# Patient Record
Sex: Female | Born: 1965 | Race: Black or African American | Hispanic: No | Marital: Single | Smoking: Never smoker
Health system: Southern US, Community
[De-identification: ages and names within clinical notes are randomized; demographics above are authoritative.]

## PROBLEM LIST (undated history)

## (undated) DIAGNOSIS — B3731 Acute candidiasis of vulva and vagina: Secondary | ICD-10-CM

## (undated) DIAGNOSIS — I10 Essential (primary) hypertension: Secondary | ICD-10-CM

## (undated) DIAGNOSIS — R300 Dysuria: Secondary | ICD-10-CM

## (undated) DIAGNOSIS — B373 Candidiasis of vulva and vagina: Secondary | ICD-10-CM

## (undated) DIAGNOSIS — N760 Acute vaginitis: Secondary | ICD-10-CM

## (undated) DIAGNOSIS — N6019 Diffuse cystic mastopathy of unspecified breast: Secondary | ICD-10-CM

## (undated) DIAGNOSIS — B9689 Other specified bacterial agents as the cause of diseases classified elsewhere: Secondary | ICD-10-CM

## (undated) DIAGNOSIS — N946 Dysmenorrhea, unspecified: Secondary | ICD-10-CM

## (undated) HISTORY — DX: Diffuse cystic mastopathy of unspecified breast: N60.19

## (undated) HISTORY — DX: Acute vaginitis: N76.0

## (undated) HISTORY — DX: Acute candidiasis of vulva and vagina: B37.31

## (undated) HISTORY — DX: Dysuria: R30.0

## (undated) HISTORY — DX: Dysmenorrhea, unspecified: N94.6

## (undated) HISTORY — DX: Candidiasis of vulva and vagina: B37.3

## (undated) HISTORY — DX: Other specified bacterial agents as the cause of diseases classified elsewhere: B96.89

## (undated) HISTORY — DX: Essential (primary) hypertension: I10

---

## 2000-09-12 ENCOUNTER — Other Ambulatory Visit: Admission: RE | Admit: 2000-09-12 | Discharge: 2000-09-12 | Payer: Self-pay | Admitting: Obstetrics and Gynecology

## 2000-09-19 ENCOUNTER — Encounter: Payer: Self-pay | Admitting: Obstetrics and Gynecology

## 2000-09-19 ENCOUNTER — Encounter: Admission: RE | Admit: 2000-09-19 | Discharge: 2000-09-19 | Payer: Self-pay | Admitting: Obstetrics and Gynecology

## 2001-09-05 ENCOUNTER — Other Ambulatory Visit: Admission: RE | Admit: 2001-09-05 | Discharge: 2001-09-05 | Payer: Self-pay | Admitting: Obstetrics and Gynecology

## 2002-09-18 ENCOUNTER — Other Ambulatory Visit: Admission: RE | Admit: 2002-09-18 | Discharge: 2002-09-18 | Payer: Self-pay | Admitting: Obstetrics and Gynecology

## 2003-07-03 ENCOUNTER — Encounter: Admission: RE | Admit: 2003-07-03 | Discharge: 2003-07-03 | Payer: Self-pay | Admitting: Internal Medicine

## 2003-10-22 ENCOUNTER — Other Ambulatory Visit: Admission: RE | Admit: 2003-10-22 | Discharge: 2003-10-22 | Payer: Self-pay | Admitting: Obstetrics and Gynecology

## 2005-01-18 ENCOUNTER — Other Ambulatory Visit: Admission: RE | Admit: 2005-01-18 | Discharge: 2005-01-18 | Payer: Self-pay | Admitting: Obstetrics and Gynecology

## 2006-01-19 ENCOUNTER — Other Ambulatory Visit: Admission: RE | Admit: 2006-01-19 | Discharge: 2006-01-19 | Payer: Self-pay | Admitting: Obstetrics and Gynecology

## 2006-03-30 ENCOUNTER — Encounter: Admission: RE | Admit: 2006-03-30 | Discharge: 2006-03-30 | Payer: Self-pay | Admitting: Internal Medicine

## 2007-05-03 ENCOUNTER — Encounter: Admission: RE | Admit: 2007-05-03 | Discharge: 2007-05-03 | Payer: Self-pay | Admitting: Internal Medicine

## 2008-05-14 ENCOUNTER — Encounter: Admission: RE | Admit: 2008-05-14 | Discharge: 2008-05-14 | Payer: Self-pay | Admitting: Internal Medicine

## 2009-05-28 ENCOUNTER — Encounter: Admission: RE | Admit: 2009-05-28 | Discharge: 2009-05-28 | Payer: Self-pay | Admitting: Internal Medicine

## 2010-05-31 LAB — BASIC METABOLIC PANEL
BUN: 12 mg/dL (ref 4–21)
Creatinine: 0.7 mg/dL (ref <=1.1)

## 2010-06-18 ENCOUNTER — Encounter
Admission: RE | Admit: 2010-06-18 | Discharge: 2010-06-18 | Payer: Self-pay | Source: Home / Self Care | Attending: Internal Medicine | Admitting: Internal Medicine

## 2010-08-13 ENCOUNTER — Ambulatory Visit
Admission: RE | Admit: 2010-08-13 | Discharge: 2010-08-13 | Disposition: A | Discharge status: Home / Self Care | Payer: Managed Care, Other (non HMO) | Source: Ambulatory Visit | Attending: Internal Medicine | Admitting: Internal Medicine

## 2010-08-13 ENCOUNTER — Other Ambulatory Visit: Payer: Self-pay | Admitting: Internal Medicine

## 2010-08-13 DIAGNOSIS — J40 Bronchitis, not specified as acute or chronic: Secondary | ICD-10-CM

## 2011-06-07 ENCOUNTER — Other Ambulatory Visit: Payer: Self-pay | Admitting: Internal Medicine

## 2011-06-07 DIAGNOSIS — Z1231 Encounter for screening mammogram for malignant neoplasm of breast: Secondary | ICD-10-CM

## 2011-06-27 DIAGNOSIS — N926 Irregular menstruation, unspecified: Secondary | ICD-10-CM | POA: Insufficient documentation

## 2011-06-29 ENCOUNTER — Ambulatory Visit: Payer: Managed Care, Other (non HMO)

## 2011-07-05 ENCOUNTER — Ambulatory Visit
Admission: RE | Admit: 2011-07-05 | Discharge: 2011-07-05 | Disposition: A | Discharge status: Home / Self Care | Payer: Managed Care, Other (non HMO) | Source: Ambulatory Visit | Attending: Internal Medicine | Admitting: Internal Medicine

## 2011-07-05 DIAGNOSIS — Z1231 Encounter for screening mammogram for malignant neoplasm of breast: Secondary | ICD-10-CM

## 2011-07-11 ENCOUNTER — Ambulatory Visit: Payer: Managed Care, Other (non HMO)

## 2011-08-16 DIAGNOSIS — I10 Essential (primary) hypertension: Secondary | ICD-10-CM

## 2011-08-16 DIAGNOSIS — N946 Dysmenorrhea, unspecified: Secondary | ICD-10-CM

## 2011-09-28 ENCOUNTER — Ambulatory Visit: Payer: Self-pay | Admitting: Obstetrics and Gynecology

## 2011-10-25 ENCOUNTER — Ambulatory Visit: Payer: Self-pay | Admitting: Obstetrics and Gynecology

## 2011-11-15 ENCOUNTER — Ambulatory Visit: Payer: Self-pay | Admitting: Obstetrics and Gynecology

## 2011-11-23 ENCOUNTER — Telehealth: Payer: Self-pay | Admitting: Obstetrics and Gynecology

## 2011-11-23 NOTE — Telephone Encounter (Signed)
Spoke with pt rgd rx refill request from [facility]. Pt stated that she is requesting the refill.told pt needed to get approval from [facility] and once approved would fax to the pharmacy.

## 2011-11-28 NOTE — Telephone Encounter (Signed)
On [employer] desk waiting on approval for rx .  bt  cma

## 2011-11-30 ENCOUNTER — Other Ambulatory Visit: Payer: Self-pay

## 2011-11-30 ENCOUNTER — Telehealth: Payer: Self-pay

## 2011-11-30 NOTE — Telephone Encounter (Signed)
Per approved for refill per [facility]  Rx for IBP 800mg  was called in at [facility] per [facility].  LC/CMA

## 2011-12-01 ENCOUNTER — Other Ambulatory Visit: Payer: Self-pay | Admitting: Obstetrics and Gynecology

## 2011-12-07 ENCOUNTER — Encounter: Payer: Self-pay | Admitting: Obstetrics and Gynecology

## 2011-12-16 ENCOUNTER — Telehealth: Payer: Self-pay

## 2011-12-16 NOTE — Telephone Encounter (Signed)
Rx request sent to Dr.Stringer will await his approval. LC CMA

## 2011-12-16 NOTE — Telephone Encounter (Signed)
Rx request for IBP 800mg  in triage pool. Pt just received IBP 800mg  on 11-30-2011 w/2 RF's #40 pills  I called pt to confirm if she needed IBP  Pt is having issues w/ her phone she could not hear me but I could hear her. I will await pt call back. [facility] CMA

## 2012-01-04 ENCOUNTER — Ambulatory Visit: Payer: Self-pay | Admitting: Obstetrics and Gynecology

## 2012-01-09 ENCOUNTER — Encounter: Payer: Self-pay | Admitting: Obstetrics and Gynecology

## 2012-01-24 ENCOUNTER — Ambulatory Visit (INDEPENDENT_AMBULATORY_CARE_PROVIDER_SITE_OTHER): Payer: Managed Care, Other (non HMO) | Admitting: Obstetrics and Gynecology

## 2012-01-24 ENCOUNTER — Encounter: Payer: Self-pay | Admitting: Obstetrics and Gynecology

## 2012-01-24 VITALS — BP 104/62 | Resp 14 | Ht 68.5 in | Wt 140.0 lb

## 2012-01-24 DIAGNOSIS — N760 Acute vaginitis: Secondary | ICD-10-CM

## 2012-01-24 MED ORDER — FLUCONAZOLE 150 MG PO TABS: 150.0000 mg | ORAL_TABLET | Freq: Every day | ORAL | Status: AC

## 2012-01-24 MED ORDER — AMBULATORY NON FORMULARY MEDICATION: 24.0000 | Status: DC

## 2012-01-24 NOTE — Progress Notes (Signed)
HISTORY OF PRESENT ILLNESS  Ms. Jennifer Orozco is a 46 y.o. year old female,No obstetric history on file., who presents for a problem visit. The patient has a history of irregular cycles.  Her cycles are now regular.  She is taking minocycline for acne.  Subjective:  The patient complains of recurrent yeast infections.  Objective:  BP 104/62  Resp 14  Ht 5' 8.5" (1.74 m)  Wt 140 lb (63.504 kg)  BMI 20.98 kg/m2  LMP 01/08/2012   GI: soft and nontender  External genitalia: normal general appearance Vaginal: normal without tenderness, induration or masses Cervix: normal appearance Adnexa: normal bimanual exam Uterus: normal size shape and consistency  Wet prep: Yeast  Assessment:  Yeast vaginitis Improved cycles  Plan:  Diflucan 150 mg Boric acid suppositories 600 mg twice each week  Return to office prn if symptoms worsen or fail to improve. Return in December 2013 for annual exam.   Leonard Schwartz M.D.  01/24/2012 5:21 PM

## 2012-04-09 ENCOUNTER — Other Ambulatory Visit: Payer: Self-pay | Admitting: Internal Medicine

## 2012-04-09 ENCOUNTER — Ambulatory Visit
Admission: RE | Admit: 2012-04-09 | Discharge: 2012-04-09 | Disposition: A | Discharge status: Home / Self Care | Payer: Managed Care, Other (non HMO) | Source: Ambulatory Visit | Attending: Internal Medicine | Admitting: Internal Medicine

## 2012-04-09 DIAGNOSIS — M79672 Pain in left foot: Secondary | ICD-10-CM

## 2012-06-22 ENCOUNTER — Other Ambulatory Visit: Payer: Self-pay | Admitting: Internal Medicine

## 2012-06-22 DIAGNOSIS — Z1231 Encounter for screening mammogram for malignant neoplasm of breast: Secondary | ICD-10-CM

## 2012-07-16 ENCOUNTER — Ambulatory Visit: Payer: Managed Care, Other (non HMO)

## 2012-08-01 ENCOUNTER — Ambulatory Visit: Payer: Managed Care, Other (non HMO) | Admitting: Obstetrics and Gynecology

## 2012-08-14 ENCOUNTER — Ambulatory Visit: Payer: Managed Care, Other (non HMO) | Admitting: Obstetrics and Gynecology

## 2012-08-15 ENCOUNTER — Inpatient Hospital Stay (HOSPITAL_BASED_OUTPATIENT_CLINIC_OR_DEPARTMENT_OTHER): Admission: RE | Admit: 2012-08-15 | Payer: Managed Care, Other (non HMO) | Source: Ambulatory Visit

## 2012-08-29 ENCOUNTER — Ambulatory Visit (HOSPITAL_BASED_OUTPATIENT_CLINIC_OR_DEPARTMENT_OTHER): Payer: Managed Care, Other (non HMO)

## 2012-09-12 ENCOUNTER — Ambulatory Visit (HOSPITAL_BASED_OUTPATIENT_CLINIC_OR_DEPARTMENT_OTHER)
Admission: RE | Admit: 2012-09-12 | Discharge: 2012-09-12 | Disposition: A | Discharge status: Home / Self Care | Payer: Managed Care, Other (non HMO) | Source: Ambulatory Visit | Attending: Internal Medicine | Admitting: Internal Medicine

## 2012-09-12 DIAGNOSIS — Z1231 Encounter for screening mammogram for malignant neoplasm of breast: Secondary | ICD-10-CM

## 2012-11-01 ENCOUNTER — Encounter: Payer: Self-pay | Admitting: *Deleted

## 2013-09-12 ENCOUNTER — Other Ambulatory Visit (HOSPITAL_BASED_OUTPATIENT_CLINIC_OR_DEPARTMENT_OTHER): Payer: Self-pay | Admitting: Internal Medicine

## 2013-09-12 DIAGNOSIS — Z1231 Encounter for screening mammogram for malignant neoplasm of breast: Secondary | ICD-10-CM

## 2013-09-18 ENCOUNTER — Ambulatory Visit (HOSPITAL_BASED_OUTPATIENT_CLINIC_OR_DEPARTMENT_OTHER)
Admission: RE | Admit: 2013-09-18 | Discharge: 2013-09-18 | Disposition: A | Discharge status: Home / Self Care | Payer: BC Managed Care – PPO | Source: Ambulatory Visit | Attending: Internal Medicine | Admitting: Internal Medicine

## 2013-09-18 DIAGNOSIS — Z1231 Encounter for screening mammogram for malignant neoplasm of breast: Secondary | ICD-10-CM | POA: Insufficient documentation

## 2014-09-15 ENCOUNTER — Other Ambulatory Visit (HOSPITAL_BASED_OUTPATIENT_CLINIC_OR_DEPARTMENT_OTHER): Payer: Self-pay | Admitting: Internal Medicine

## 2014-09-15 DIAGNOSIS — Z1231 Encounter for screening mammogram for malignant neoplasm of breast: Secondary | ICD-10-CM

## 2014-10-10 ENCOUNTER — Ambulatory Visit (HOSPITAL_BASED_OUTPATIENT_CLINIC_OR_DEPARTMENT_OTHER): Payer: Managed Care, Other (non HMO)

## 2014-10-14 ENCOUNTER — Ambulatory Visit (HOSPITAL_BASED_OUTPATIENT_CLINIC_OR_DEPARTMENT_OTHER): Payer: Managed Care, Other (non HMO)

## 2014-10-21 ENCOUNTER — Ambulatory Visit (HOSPITAL_BASED_OUTPATIENT_CLINIC_OR_DEPARTMENT_OTHER): Payer: Managed Care, Other (non HMO)

## 2014-10-28 ENCOUNTER — Ambulatory Visit (HOSPITAL_BASED_OUTPATIENT_CLINIC_OR_DEPARTMENT_OTHER): Payer: Managed Care, Other (non HMO)

## 2014-11-11 ENCOUNTER — Ambulatory Visit (HOSPITAL_BASED_OUTPATIENT_CLINIC_OR_DEPARTMENT_OTHER)
Admission: RE | Admit: 2014-11-11 | Discharge: 2014-11-11 | Disposition: A | Discharge status: Home / Self Care | Payer: 59 | Source: Ambulatory Visit | Attending: Internal Medicine | Admitting: Internal Medicine

## 2014-11-11 DIAGNOSIS — Z1231 Encounter for screening mammogram for malignant neoplasm of breast: Secondary | ICD-10-CM | POA: Insufficient documentation

## 2016-04-08 ENCOUNTER — Ambulatory Visit: Payer: Self-pay | Admitting: Podiatry

## 2016-04-11 ENCOUNTER — Ambulatory Visit: Payer: Self-pay

## 2016-04-11 ENCOUNTER — Ambulatory Visit (INDEPENDENT_AMBULATORY_CARE_PROVIDER_SITE_OTHER): Payer: Commercial Managed Care - HMO | Admitting: Podiatry

## 2016-04-11 ENCOUNTER — Encounter: Payer: Self-pay | Admitting: Podiatry

## 2016-04-11 VITALS — BP 140/67 | HR 57 | Resp 16 | Ht 68.5 in | Wt 145.0 lb

## 2016-04-11 DIAGNOSIS — Q828 Other specified congenital malformations of skin: Secondary | ICD-10-CM | POA: Diagnosis not present

## 2016-04-11 DIAGNOSIS — M79671 Pain in right foot: Secondary | ICD-10-CM

## 2016-04-11 DIAGNOSIS — B070 Plantar wart: Secondary | ICD-10-CM | POA: Diagnosis not present

## 2016-04-11 NOTE — Progress Notes (Signed)
   Subjective:    Patient ID: Jennifer Orozco, female    DOB: 28-Dec-1965, 50 y.o.   MRN: [id number]  HPI 50 year old female presents the office today for painful lesion to the bottom of the right foot and the ball the foot which is been ongoing for a long time. She states that she saw her primary care physician and she was told it was a wart. She said no treatment for this. She says the pain is intermittent and worse with a lot of walking or weightbearing. Denies any redness or drainage and denies today for not 6. No other complaints at this time.   Review of Systems  All other systems reviewed and are negative.      Objective:   Physical Exam General: AAO x3, NAD  Dermatological: On the right foot submetatarsal to there is a hyperkeratotic lesion. Upon debridement there is no pinpoint bleeding there is no evidence of verruca. There is no evidence of foreign body.  Vascular: Dorsalis Pedis artery and Posterior Tibial artery pedal pulses are 2/4 bilateral with immedate capillary fill time. There is no pain with calf compression, swelling, warmth, erythema.   Neruologic: Grossly intact via light touch bilateral. Vibratory intact via tuning fork bilateral. Protective threshold with Semmes Wienstein monofilament intact to all pedal sites bilateral.   Musculoskeletal: Tenderness sharply on the hyperkeratotic lesion on the right foot. Mild HAV is present. No other areas of tenderness. No gross boney pedal deformities bilateral. No pain, crepitus, or limitation noted with foot and ankle range of motion bilateral. Muscular strength 5/5 in all groups tested bilateral.  Gait: Unassisted, Nonantalgic.    Assessment & Plan:  50 year old female right foot submetatarsal to porokeratosis -Treatment options discussed including all alternatives, risks, and complications -Etiology of symptoms were discussed -Hyperkeratotic lesion debrided without complications or bleeding. The area was cleaned. Pad was  placed followed by salinocaine and an occlusive bandage. Post procedure instructions were discussed. Monitor for infection. -Discussed shoe modifications and orthotics -Follow up if symptoms continue. Call any questions or concerns meantime.  Ovid CurdMatthew Wagoner, DPM  *x-ray next appointment if symptoms continue.

## 2017-03-14 ENCOUNTER — Encounter (HOSPITAL_BASED_OUTPATIENT_CLINIC_OR_DEPARTMENT_OTHER): Payer: Self-pay

## 2017-03-14 ENCOUNTER — Emergency Department (HOSPITAL_BASED_OUTPATIENT_CLINIC_OR_DEPARTMENT_OTHER)
Admission: EM | Admit: 2017-03-14 | Discharge: 2017-03-14 | Disposition: A | Discharge status: Home / Self Care | Payer: 59 | Attending: Emergency Medicine | Admitting: Emergency Medicine

## 2017-03-14 ENCOUNTER — Emergency Department (HOSPITAL_BASED_OUTPATIENT_CLINIC_OR_DEPARTMENT_OTHER): Payer: 59

## 2017-03-14 DIAGNOSIS — Z79899 Other long term (current) drug therapy: Secondary | ICD-10-CM | POA: Diagnosis not present

## 2017-03-14 DIAGNOSIS — I10 Essential (primary) hypertension: Secondary | ICD-10-CM | POA: Diagnosis not present

## 2017-03-14 DIAGNOSIS — R202 Paresthesia of skin: Secondary | ICD-10-CM

## 2017-03-14 DIAGNOSIS — E785 Hyperlipidemia, unspecified: Secondary | ICD-10-CM | POA: Insufficient documentation

## 2017-03-14 LAB — COMPREHENSIVE METABOLIC PANEL
ALK PHOS: 44 U/L (ref 38–126)
ALT: 13 U/L — ABNORMAL LOW (ref 14–54)
ANION GAP: 6 (ref 5–15)
AST: 18 U/L (ref 15–41)
Albumin: 4.3 g/dL (ref 3.5–5.0)
BILIRUBIN TOTAL: 0.8 mg/dL (ref 0.3–1.2)
BUN: 9 mg/dL (ref 6–20)
CALCIUM: 9.7 mg/dL (ref 8.9–10.3)
CO2: 27 mmol/L (ref 22–32)
Chloride: 104 mmol/L (ref 101–111)
Creatinine, Ser: 0.72 mg/dL (ref 0.44–1.00)
GLUCOSE: 89 mg/dL (ref 65–99)
Potassium: 3.8 mmol/L (ref 3.5–5.1)
Sodium: 137 mmol/L (ref 135–145)
TOTAL PROTEIN: 8.5 g/dL — AB (ref 6.5–8.1)

## 2017-03-14 LAB — CBC WITH DIFFERENTIAL/PLATELET
BASOS ABS: 0 10*3/uL (ref 0.0–0.1)
BASOS PCT: 0 %
Eosinophils Absolute: 0 10*3/uL (ref 0.0–0.7)
Eosinophils Relative: 0 %
HEMATOCRIT: 39.3 % (ref 36.0–46.0)
Hemoglobin: 13.8 g/dL (ref 12.0–15.0)
Lymphocytes Relative: 29 %
Lymphs Abs: 2.1 10*3/uL (ref 0.7–4.0)
MCH: 27 pg (ref 26.0–34.0)
MCHC: 35.1 g/dL (ref 30.0–36.0)
MCV: 76.8 fL — ABNORMAL LOW (ref 78.0–100.0)
MONO ABS: 0.5 10*3/uL (ref 0.1–1.0)
Monocytes Relative: 6 %
NEUTROS ABS: 4.7 10*3/uL (ref 1.7–7.7)
NEUTROS PCT: 65 %
Platelets: 242 10*3/uL (ref 150–400)
RBC: 5.12 MIL/uL — AB (ref 3.87–5.11)
RDW: 15.5 % (ref 11.5–15.5)
WBC: 7.3 10*3/uL (ref 4.0–10.5)

## 2017-03-14 LAB — PROTIME-INR
INR: 1.01
Prothrombin Time: 13.2 seconds (ref 11.4–15.2)

## 2017-03-14 LAB — APTT: APTT: 31 s (ref 24–36)
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
## 2017-03-14 NOTE — ED Notes (Signed)
Patient transported to CT 

## 2017-03-14 NOTE — ED Triage Notes (Signed)
C/o a tingling sensation to left arm, "feel shaky" x 30 min-NAD-steady gait

## 2017-03-14 NOTE — Discharge Instructions (Signed)
It was my pleasure taking care of you today!   Fortunately your labs and imaging today were very reassuring.   You need to call your primary care provider to arrange for a follow up appointment.   Return to ER for new or worsening symptoms, any additional concerns.

## 2020-11-24 ENCOUNTER — Ambulatory Visit
Admission: RE | Admit: 2020-11-24 | Discharge: 2020-11-24 | Disposition: A | Discharge status: Home / Self Care | Payer: No Typology Code available for payment source | Source: Ambulatory Visit | Attending: Internal Medicine | Admitting: Internal Medicine

## 2020-11-24 ENCOUNTER — Other Ambulatory Visit: Payer: Self-pay | Admitting: Internal Medicine

## 2020-11-24 DIAGNOSIS — K5900 Constipation, unspecified: Secondary | ICD-10-CM

## 2020-11-24 DIAGNOSIS — R109 Unspecified abdominal pain: Secondary | ICD-10-CM

## 2021-04-19 ENCOUNTER — Other Ambulatory Visit: Payer: Self-pay | Admitting: Orthopedic Surgery

## 2021-05-31 ENCOUNTER — Ambulatory Visit (HOSPITAL_BASED_OUTPATIENT_CLINIC_OR_DEPARTMENT_OTHER): Admit: 2021-05-31 | Payer: No Typology Code available for payment source | Admitting: Orthopedic Surgery

## 2021-05-31 ENCOUNTER — Encounter (HOSPITAL_BASED_OUTPATIENT_CLINIC_OR_DEPARTMENT_OTHER): Payer: Self-pay

## 2021-05-31 SURGERY — RELEASE, A1 PULLEY, FOR TRIGGER FINGER
Anesthesia: Choice | Laterality: Right

## 2021-11-03 ENCOUNTER — Other Ambulatory Visit: Payer: Self-pay | Admitting: Internal Medicine

## 2021-11-03 ENCOUNTER — Ambulatory Visit
Admission: RE | Admit: 2021-11-03 | Discharge: 2021-11-03 | Disposition: A | Discharge status: Home / Self Care | Payer: No Typology Code available for payment source | Source: Ambulatory Visit | Attending: Internal Medicine | Admitting: Internal Medicine

## 2021-11-03 DIAGNOSIS — I10 Essential (primary) hypertension: Secondary | ICD-10-CM

## 2021-12-22 IMAGING — CR DG ABDOMEN 2V
2 series · 2 of 2 positions shown · non-contrast
Comparison: None.

CLINICAL DATA: Abdominal pain.  Constipation and diarrhea.

EXAM:
ABDOMEN - 2 VIEW

[w abdomen upright]
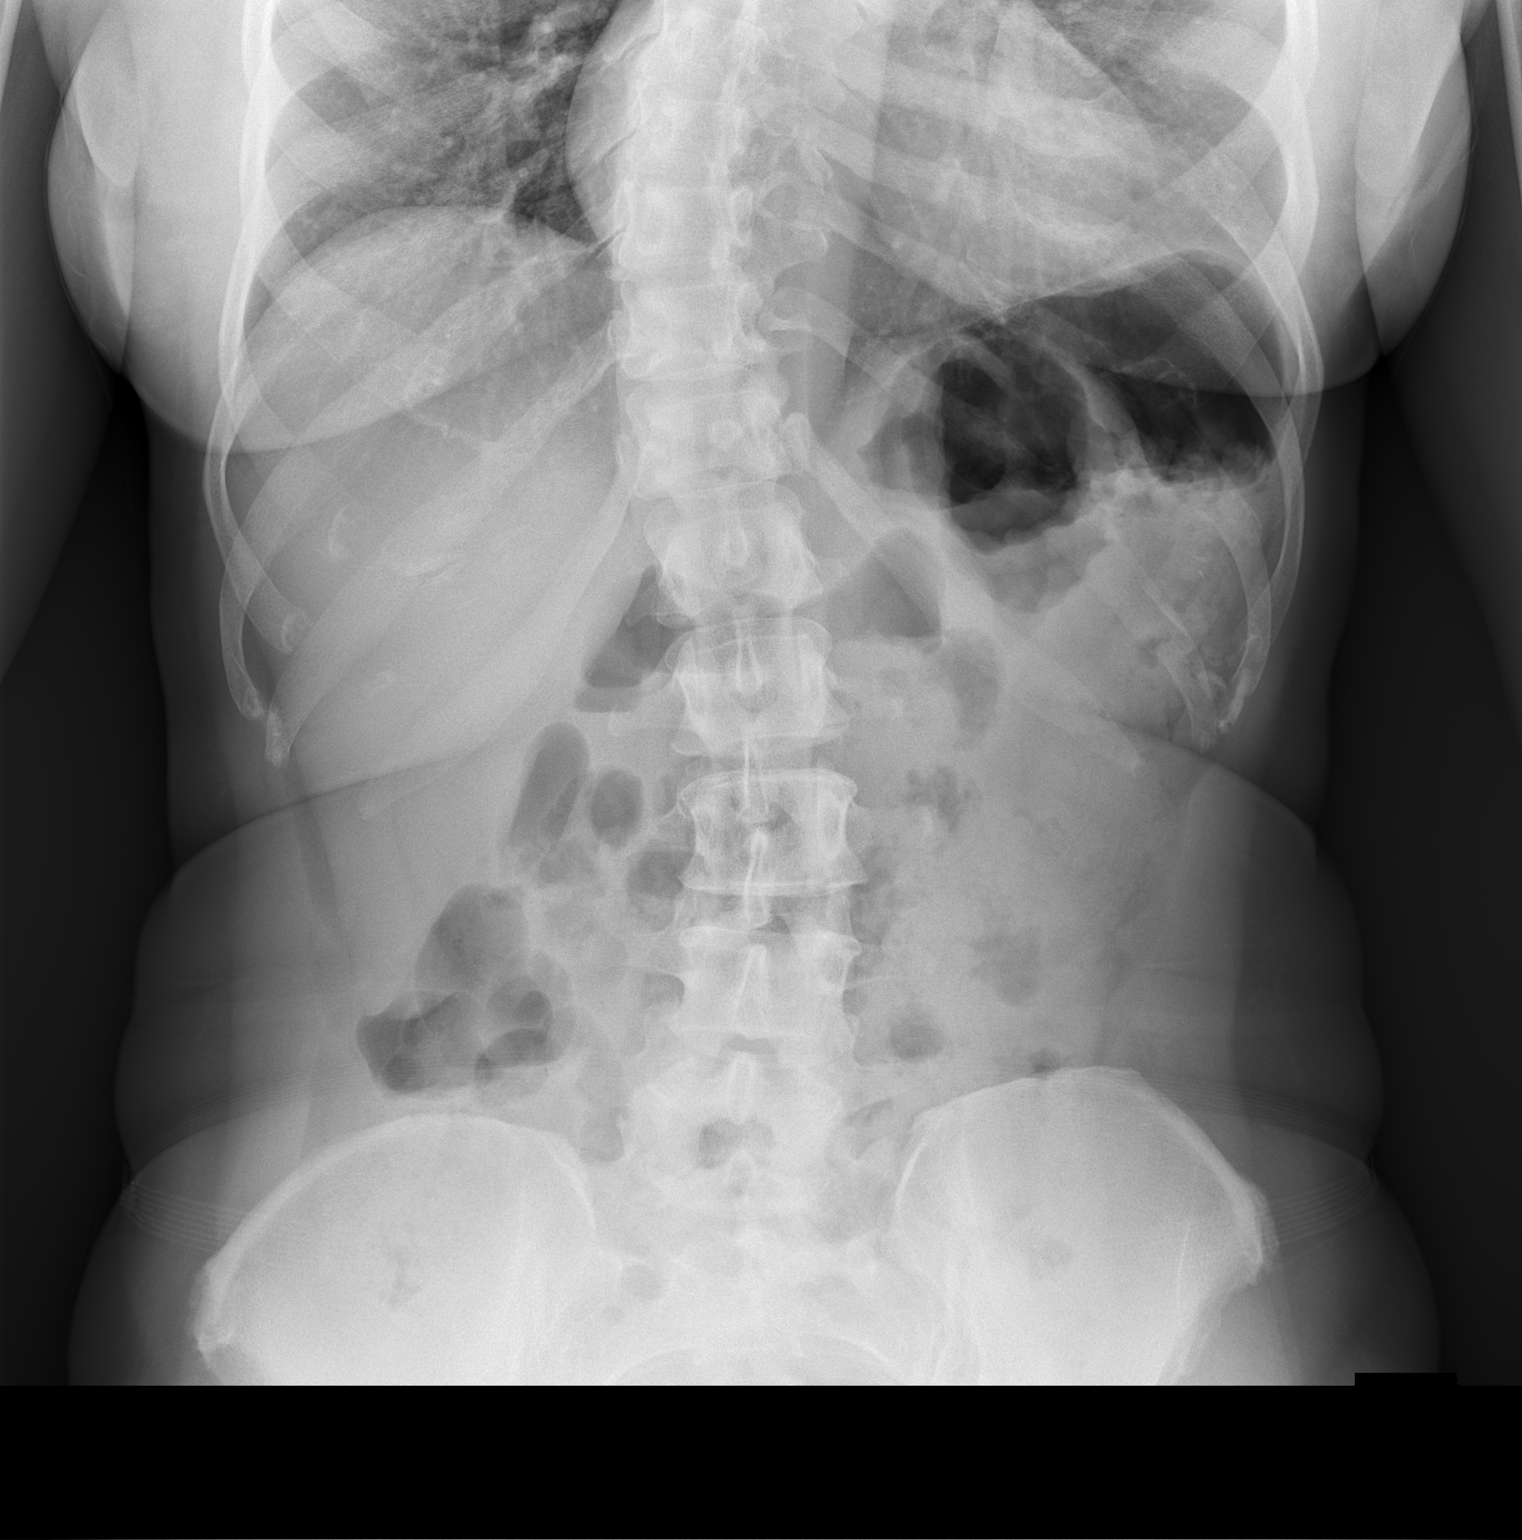

[t abdomen supine]
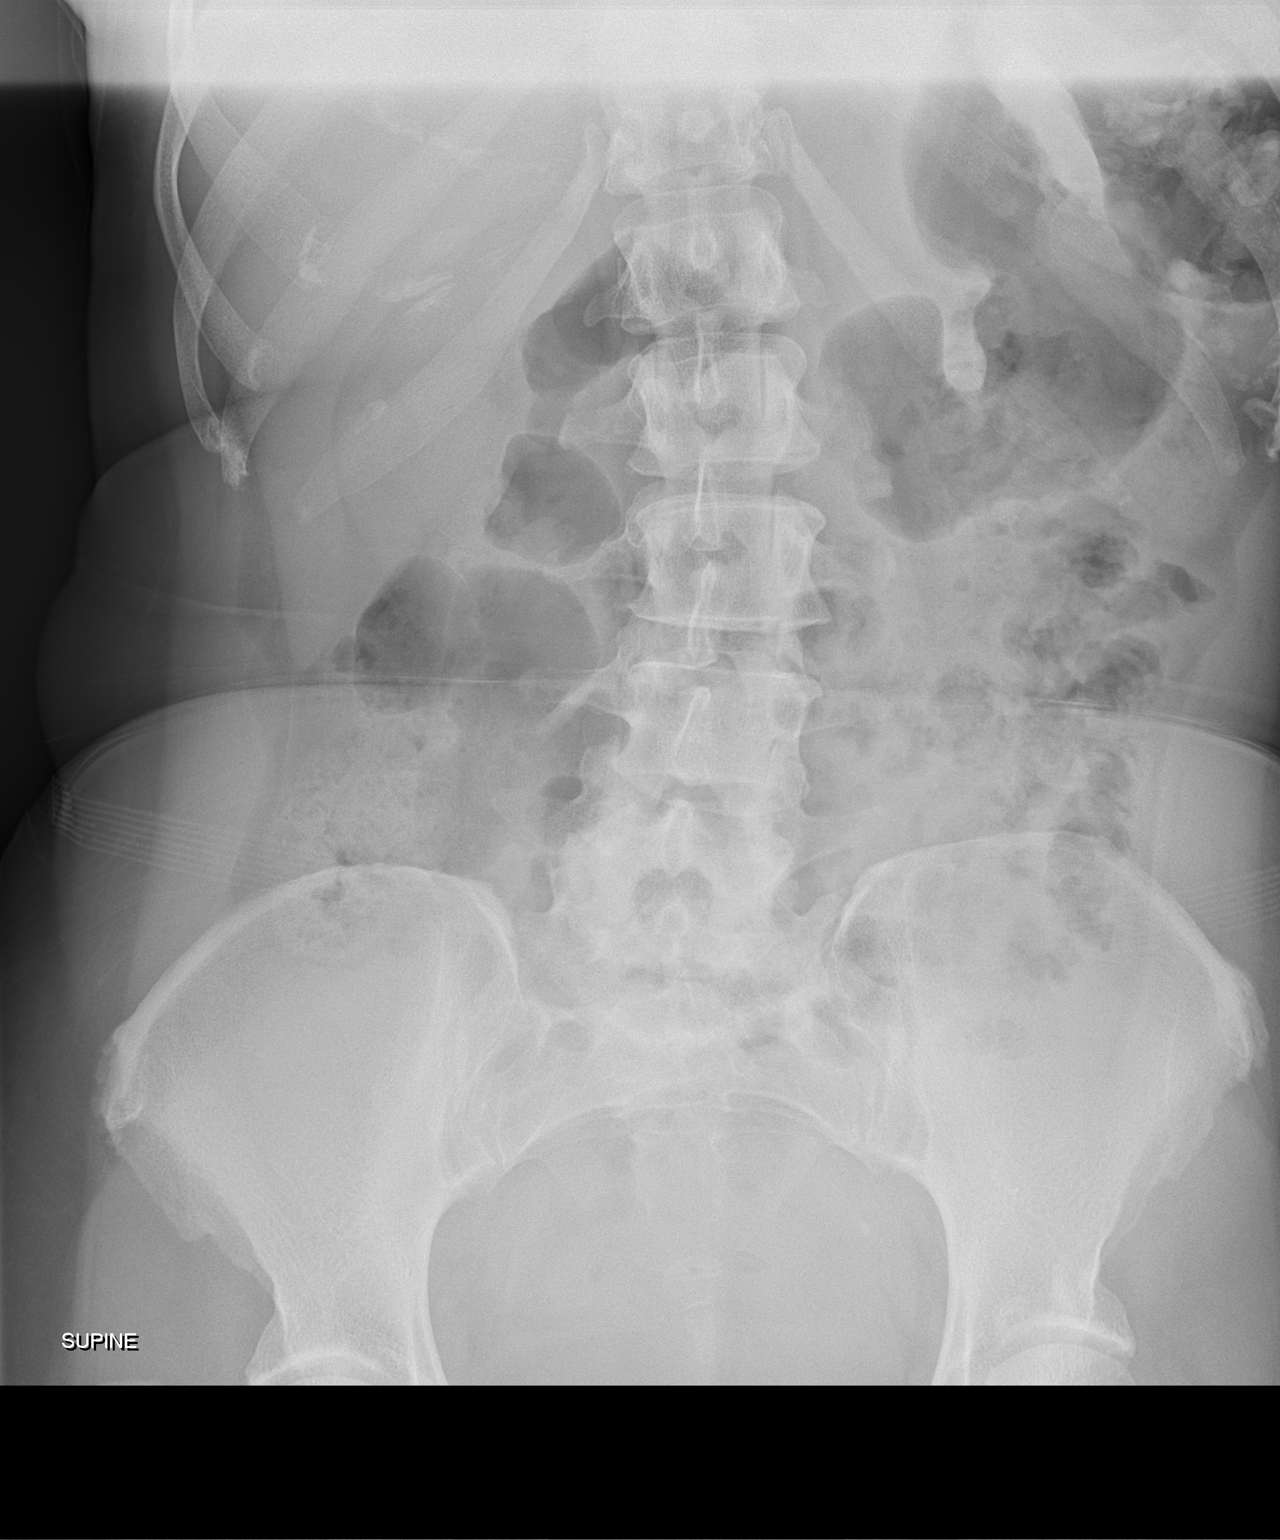

[2 of 2 positions shown; findings below may reference images not displayed]

FINDINGS: Moderate colonic stool burden. Potential bowel wall thickening
involving several loops of small bowel within the left mid
hemiabdomen without evidence of gaseous distension or enteric
obstruction. No pneumoperitoneum, pneumatosis or portal venous gas.
Limited visualization of the lower thorax is normal.
Mild-to-moderate scoliotic curvature of the thoracolumbar spine,
potentially accentuated due to positioning.
IMPRESSION: 1. Moderate colonic stool burden without evidence of enteric
obstruction.
2. Potential mild bowel wall thickening involving several loops of
small bowel within the left mid hemiabdomen, nonspecific though
could be seen in the setting of an enteritis. Clinical correlation
is advised.

## 2022-04-14 LAB — EXTERNAL GENERIC LAB PROCEDURE

## 2022-04-14 LAB — COLOGUARD

## 2022-06-22 LAB — EXTERNAL GENERIC LAB PROCEDURE: COLOGUARD: NEGATIVE

## 2022-06-22 LAB — COLOGUARD: COLOGUARD: NEGATIVE

## 2022-12-01 IMAGING — CR DG CHEST 2V
2 series · 2 of 2 positions shown · non-contrast
Comparison: 08/13/2010

CLINICAL DATA: 55-year-old female with a history of hypertension

EXAM:
CHEST - 2 VIEW

[w chest pa]
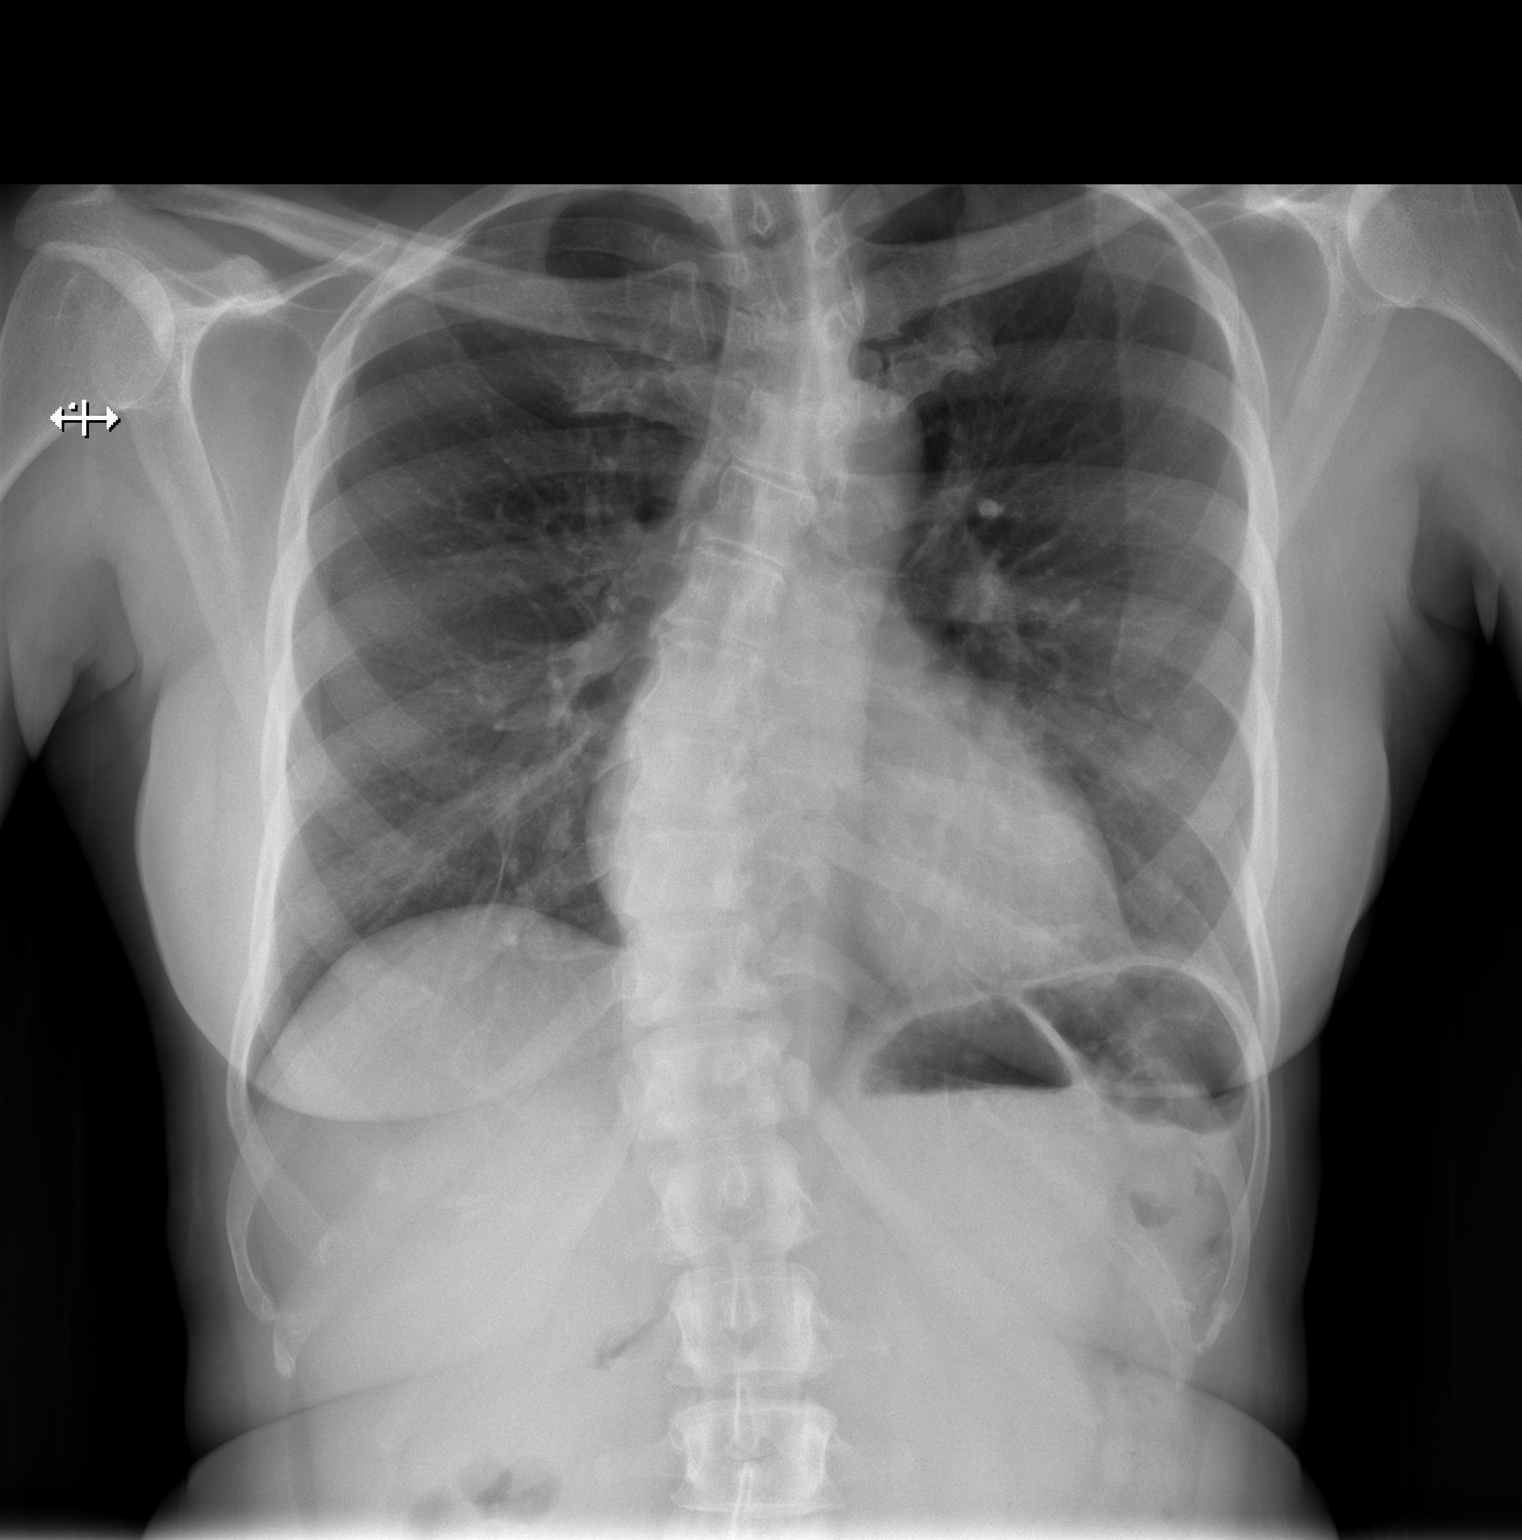

[w chest lat]
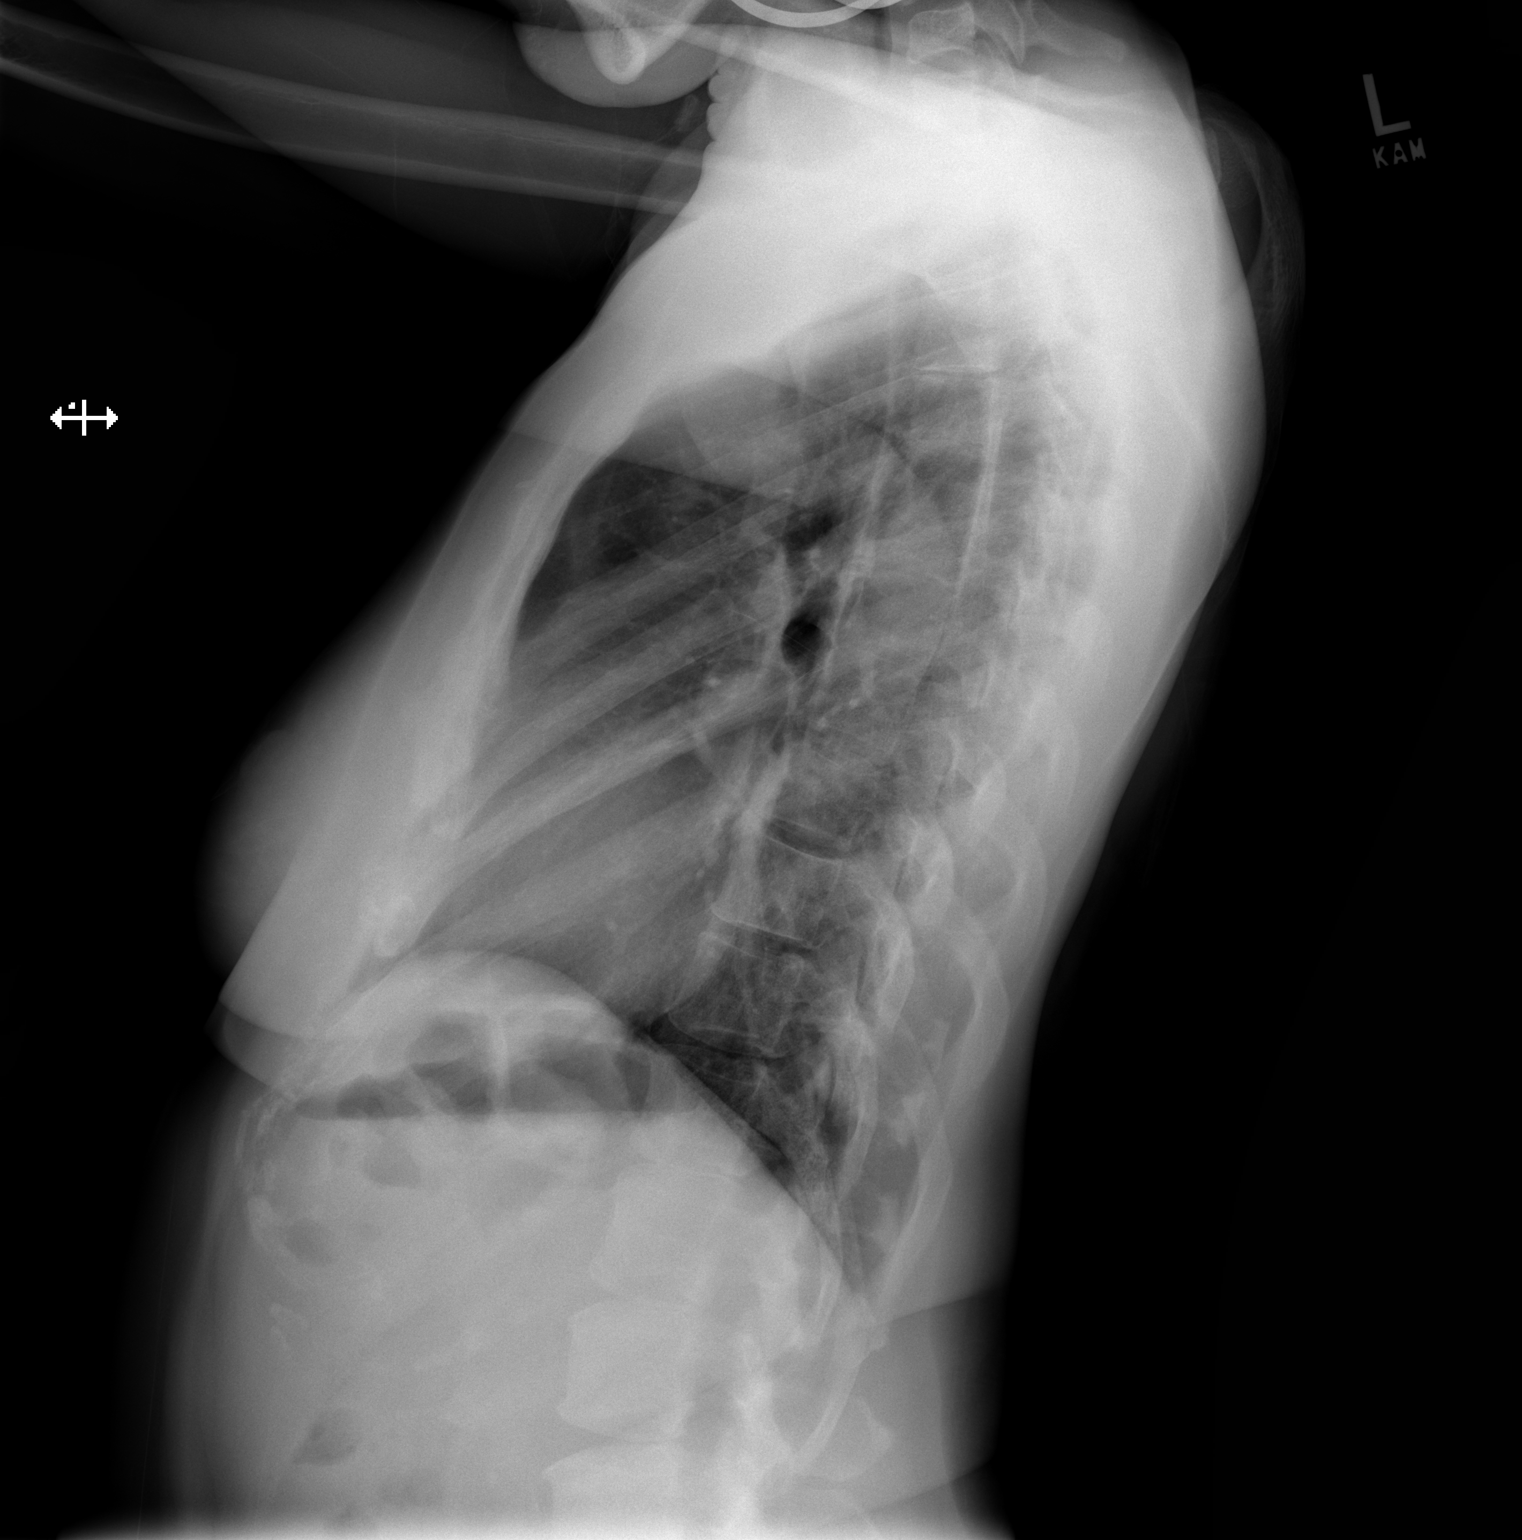

[2 of 2 positions shown; findings below may reference images not displayed]

FINDINGS: Cardiomediastinal silhouette unchanged in size and contour. No
evidence of central vascular congestion. No interlobular septal
thickening. No pneumothorax or pleural effusion. No confluent
airspace disease.

No acute displaced fracture

Scoliotic curvature again demonstrated.
IMPRESSION: Negative for acute cardiopulmonary disease

## 2022-12-26 ENCOUNTER — Ambulatory Visit
Admission: RE | Admit: 2022-12-26 | Discharge: 2022-12-26 | Disposition: A | Discharge status: Home / Self Care | Payer: No Typology Code available for payment source | Source: Ambulatory Visit | Attending: Internal Medicine | Admitting: Internal Medicine

## 2022-12-26 ENCOUNTER — Other Ambulatory Visit: Payer: Self-pay | Admitting: Internal Medicine

## 2022-12-26 DIAGNOSIS — M898X1 Other specified disorders of bone, shoulder: Secondary | ICD-10-CM

## 2023-07-03 ENCOUNTER — Other Ambulatory Visit: Payer: Self-pay | Admitting: Obstetrics and Gynecology

## 2023-07-03 DIAGNOSIS — Z1231 Encounter for screening mammogram for malignant neoplasm of breast: Secondary | ICD-10-CM

## 2023-07-26 ENCOUNTER — Ambulatory Visit: Payer: No Typology Code available for payment source

## 2023-07-27 ENCOUNTER — Other Ambulatory Visit: Payer: Self-pay | Admitting: Obstetrics and Gynecology

## 2023-07-27 DIAGNOSIS — N6310 Unspecified lump in the right breast, unspecified quadrant: Secondary | ICD-10-CM

## 2023-08-19 ENCOUNTER — Other Ambulatory Visit: Payer: No Typology Code available for payment source

## 2023-08-23 ENCOUNTER — Other Ambulatory Visit: Payer: No Typology Code available for payment source

## 2023-08-24 ENCOUNTER — Ambulatory Visit
Admission: RE | Admit: 2023-08-24 | Discharge: 2023-08-24 | Disposition: A | Discharge status: Home / Self Care | Payer: No Typology Code available for payment source | Source: Ambulatory Visit | Attending: Obstetrics and Gynecology | Admitting: Obstetrics and Gynecology

## 2023-08-24 ENCOUNTER — Other Ambulatory Visit: Payer: Self-pay | Admitting: Obstetrics and Gynecology

## 2023-08-24 DIAGNOSIS — N6310 Unspecified lump in the right breast, unspecified quadrant: Secondary | ICD-10-CM

## 2023-08-24 DIAGNOSIS — R921 Mammographic calcification found on diagnostic imaging of breast: Secondary | ICD-10-CM

## 2023-09-01 ENCOUNTER — Ambulatory Visit
Admission: RE | Admit: 2023-09-01 | Discharge: 2023-09-01 | Disposition: A | Discharge status: Home / Self Care | Payer: No Typology Code available for payment source | Source: Ambulatory Visit | Attending: Obstetrics and Gynecology | Admitting: Obstetrics and Gynecology

## 2023-09-01 ENCOUNTER — Ambulatory Visit
Admission: RE | Admit: 2023-09-01 | Discharge: 2023-09-01 | Disposition: A | Discharge status: Home / Self Care | Source: Ambulatory Visit | Attending: Obstetrics and Gynecology | Admitting: Obstetrics and Gynecology

## 2023-09-01 ENCOUNTER — Other Ambulatory Visit: Payer: Self-pay | Admitting: Obstetrics and Gynecology

## 2023-09-01 DIAGNOSIS — R921 Mammographic calcification found on diagnostic imaging of breast: Secondary | ICD-10-CM

## 2024-03-07 ENCOUNTER — Other Ambulatory Visit: Payer: Self-pay | Admitting: Obstetrics and Gynecology

## 2024-03-07 DIAGNOSIS — R921 Mammographic calcification found on diagnostic imaging of breast: Secondary | ICD-10-CM

## 2024-03-26 ENCOUNTER — Encounter

## 2024-04-04 ENCOUNTER — Encounter

## 2024-04-10 ENCOUNTER — Encounter

## 2024-04-25 ENCOUNTER — Encounter

## 2024-05-10 ENCOUNTER — Ambulatory Visit
Admission: RE | Admit: 2024-05-10 | Discharge: 2024-05-10 | Disposition: A | Discharge status: Home / Self Care | Source: Ambulatory Visit | Attending: Obstetrics and Gynecology | Admitting: Obstetrics and Gynecology

## 2024-05-10 DIAGNOSIS — R921 Mammographic calcification found on diagnostic imaging of breast: Secondary | ICD-10-CM
